# Patient Record
Sex: Female | Born: 1960 | Race: White | Hispanic: No | Marital: Married | Smoking: Former smoker
Health system: Southern US, Community
[De-identification: ages and names within clinical notes are randomized; demographics above are authoritative.]

## PROBLEM LIST (undated history)

## (undated) DIAGNOSIS — R202 Paresthesia of skin: Secondary | ICD-10-CM

## (undated) DIAGNOSIS — K219 Gastro-esophageal reflux disease without esophagitis: Secondary | ICD-10-CM

## (undated) DIAGNOSIS — E785 Hyperlipidemia, unspecified: Secondary | ICD-10-CM

## (undated) DIAGNOSIS — N809 Endometriosis, unspecified: Secondary | ICD-10-CM

## (undated) DIAGNOSIS — Z91014 Allergy to mammalian meats: Secondary | ICD-10-CM

## (undated) DIAGNOSIS — I10 Essential (primary) hypertension: Secondary | ICD-10-CM

## (undated) DIAGNOSIS — A6920 Lyme disease, unspecified: Secondary | ICD-10-CM

## (undated) DIAGNOSIS — F32A Depression, unspecified: Secondary | ICD-10-CM

## (undated) DIAGNOSIS — F419 Anxiety disorder, unspecified: Secondary | ICD-10-CM

## (undated) DIAGNOSIS — R200 Anesthesia of skin: Secondary | ICD-10-CM

## (undated) HISTORY — DX: Depression, unspecified: F32.A

## (undated) HISTORY — DX: Endometriosis, unspecified: N80.9

## (undated) HISTORY — DX: Anesthesia of skin: R20.2

## (undated) HISTORY — DX: Lyme disease, unspecified: A69.20

## (undated) HISTORY — DX: Anxiety disorder, unspecified: F41.9

## (undated) HISTORY — DX: Gastro-esophageal reflux disease without esophagitis: K21.9

## (undated) HISTORY — DX: Allergy to mammalian meats: Z91.014

## (undated) HISTORY — PX: TONSILLECTOMY: SUR1361

## (undated) HISTORY — DX: Anesthesia of skin: R20.0

## (undated) HISTORY — DX: Essential (primary) hypertension: I10

## (undated) HISTORY — DX: Hyperlipidemia, unspecified: E78.5

## (undated) HISTORY — PX: EYE SURGERY: SHX253

## (undated) HISTORY — PX: OTHER SURGICAL HISTORY: SHX169

---

## 2006-08-29 ENCOUNTER — Ambulatory Visit (HOSPITAL_COMMUNITY): Admission: RE | Admit: 2006-08-29 | Discharge: 2006-08-30 | Payer: Self-pay | Admitting: Otolaryngology

## 2008-04-16 IMAGING — CR DG CHEST 2V
2 series · 2 of 2 positions shown · non-contrast
Comparison: None.

CLINICAL DATA: Pre-op eye surgery.
 CHEST - 2 VIEW:

[view not recorded (1 of 2)]
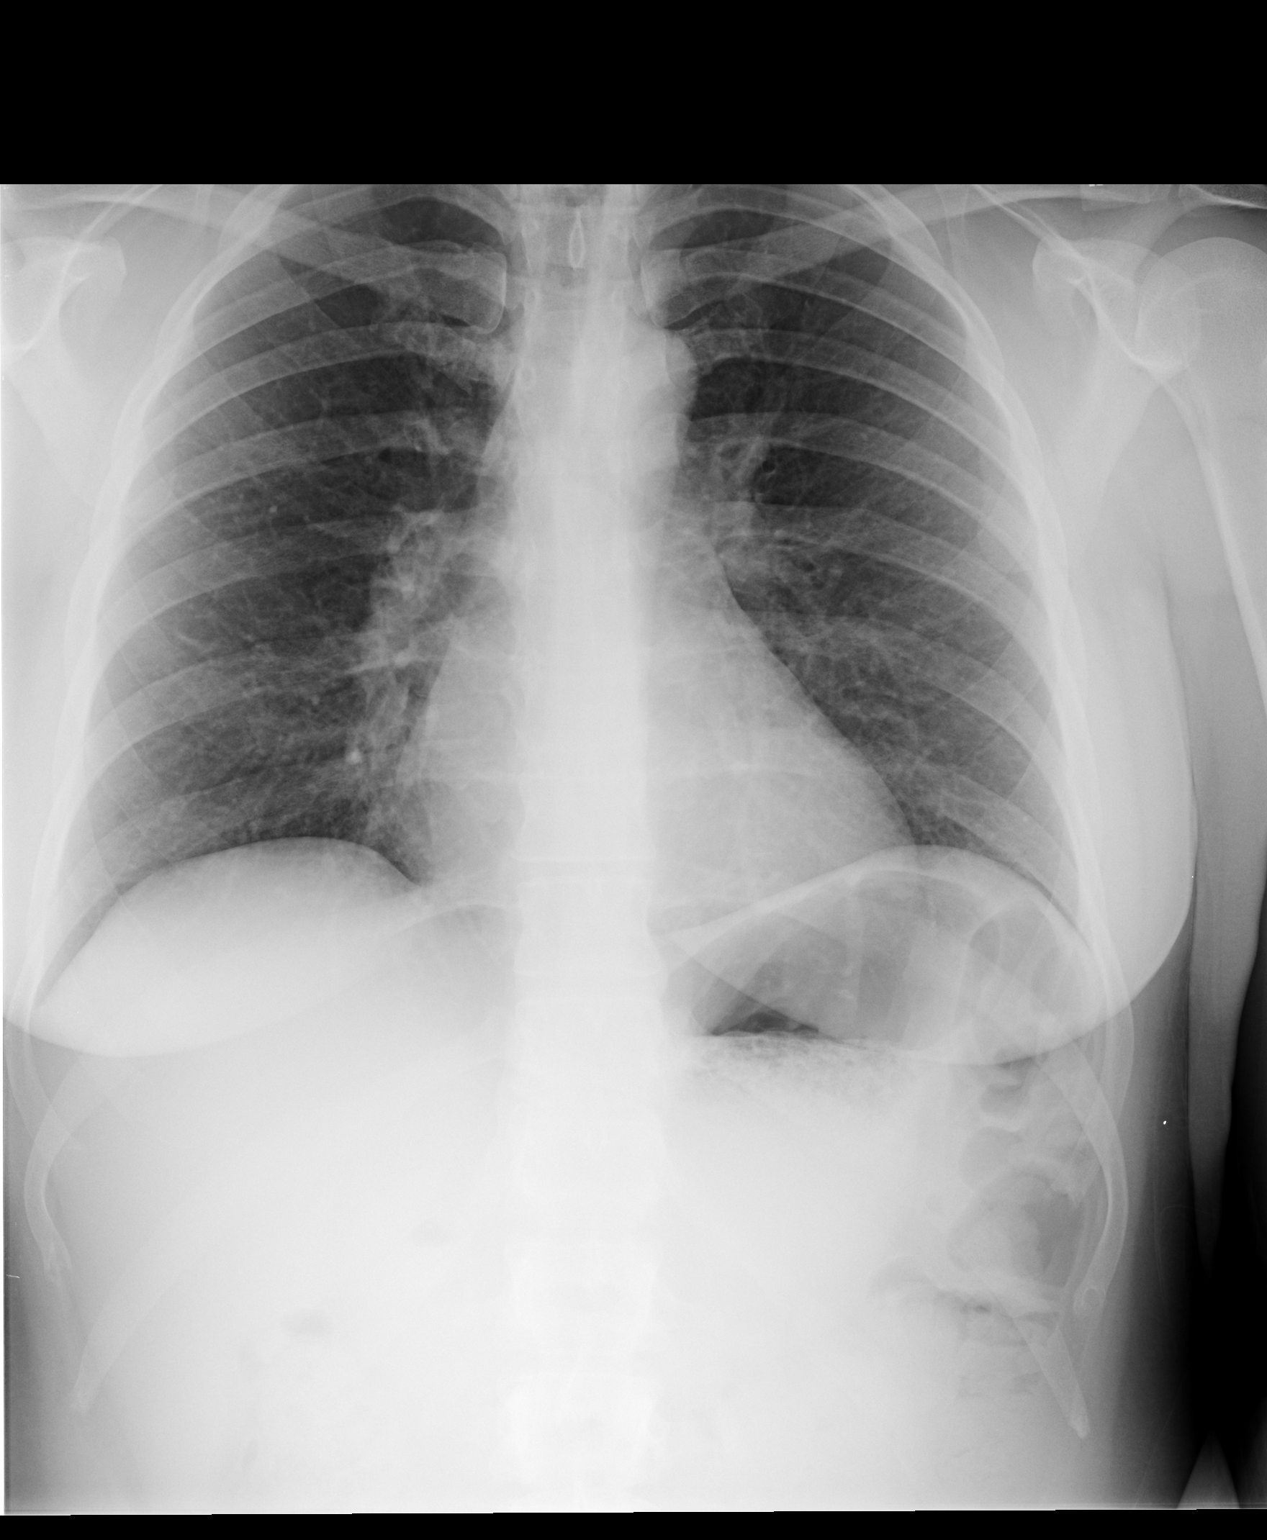

[view not recorded (2 of 2)]
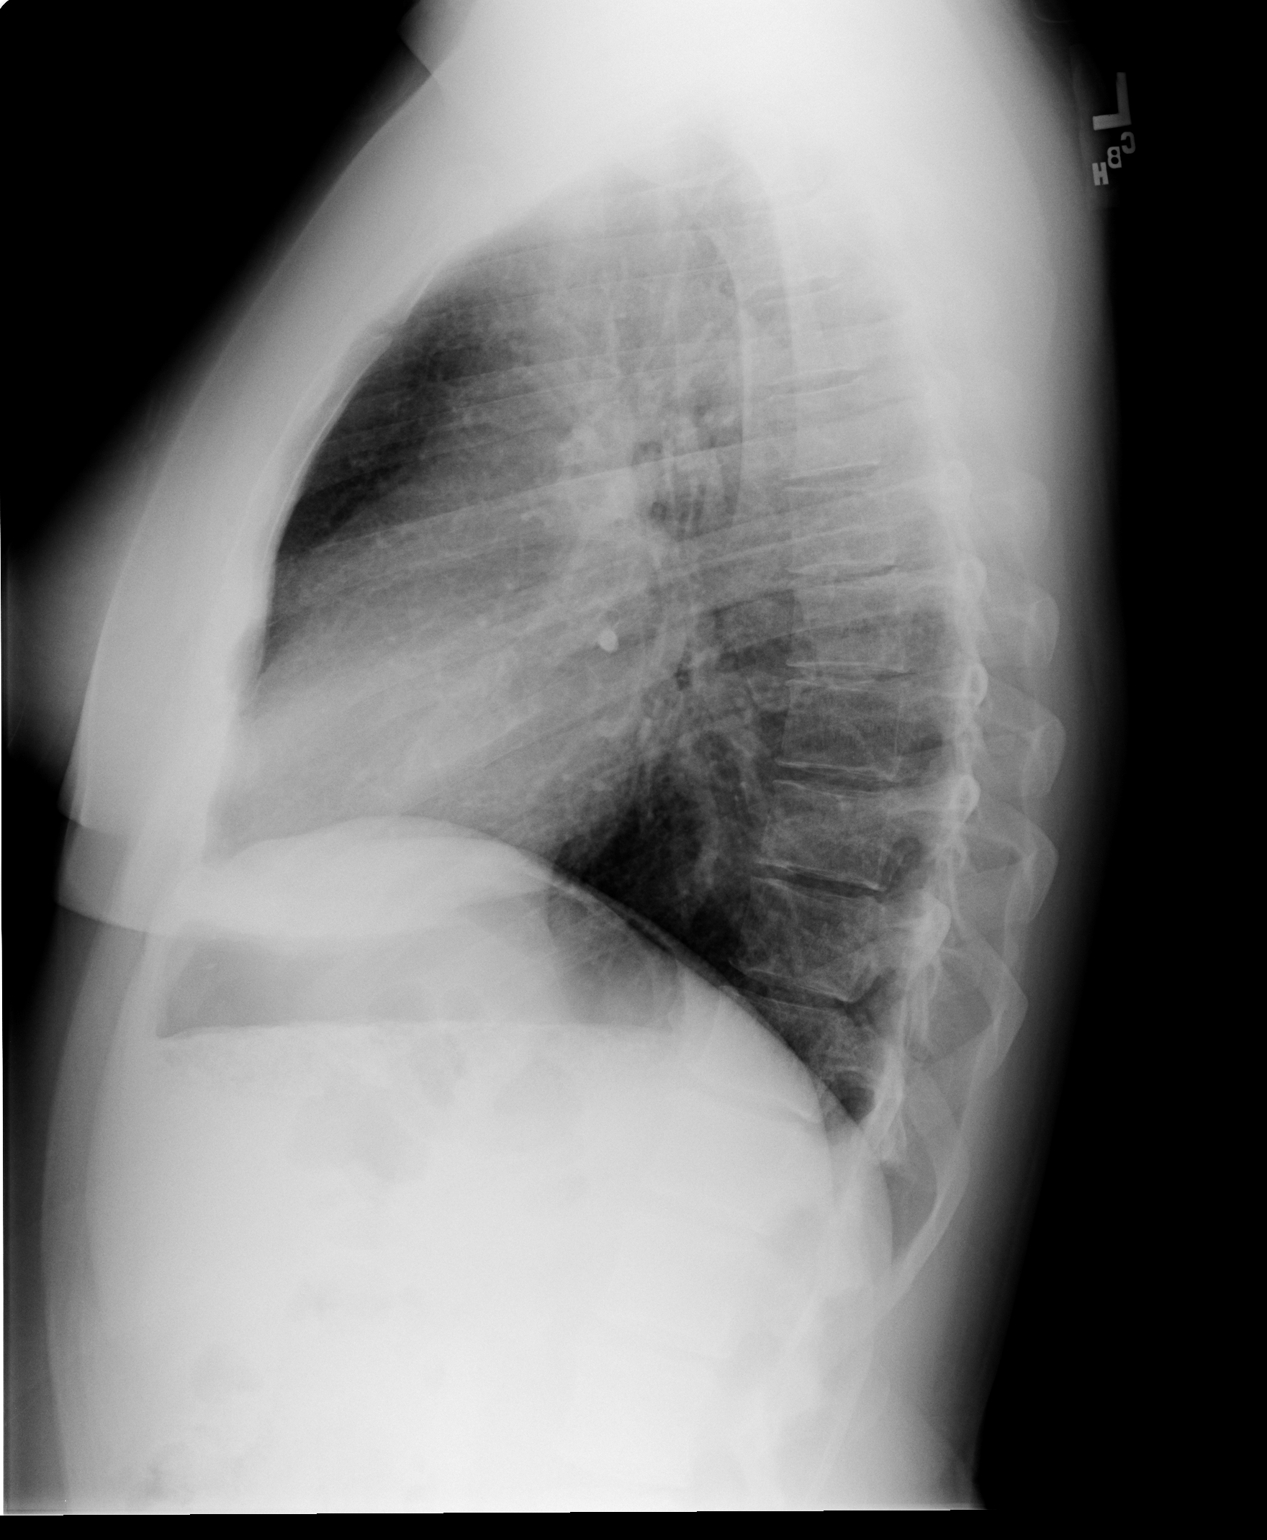

[2 of 2 positions shown; findings below may reference images not displayed]

FINDINGS: Heart size is normal, the vascularity is normal and the lungs are clear.  There is no heart failure or mass.
IMPRESSION: No active disease.

## 2015-12-04 HISTORY — PX: FOOT SURGERY: SHX648

## 2022-09-19 ENCOUNTER — Encounter: Payer: Self-pay | Admitting: *Deleted

## 2022-09-19 ENCOUNTER — Ambulatory Visit: Payer: BC Managed Care – PPO | Admitting: Diagnostic Neuroimaging

## 2022-09-19 VITALS — BP 174/98 | HR 74 | Ht 66.0 in | Wt 201.2 lb

## 2022-09-19 DIAGNOSIS — G4719 Other hypersomnia: Secondary | ICD-10-CM

## 2022-09-19 DIAGNOSIS — R0683 Snoring: Secondary | ICD-10-CM

## 2022-09-19 DIAGNOSIS — R200 Anesthesia of skin: Secondary | ICD-10-CM | POA: Diagnosis not present

## 2022-09-19 NOTE — Progress Notes (Signed)
[facility]  PATIENT: Rachel Ramos DOB: 06-30-1961  REFERRING CLINICIAN: Suzan Slick, MD HISTORY FROM: patient  REASON FOR VISIT: new consult   HISTORICAL  CHIEF COMPLAINT:  Chief Complaint  Patient presents with   Numbness/tingling of Feet    Rm 6 New Pt  Alone "problem x 1 year; stopped lipitor and numbness has improved some- but feels differently; want to discuss sleep study for snoring"    HISTORY OF PRESENT ILLNESS:   61 year old female here for evaluation of numbness and tingling.  Symptoms started about 1 year ago with numbness tingling in the toes and balls of feet.  She was using a different type of office desk, resting her bare feet across a metal bar for long periods of time.  Had a little bit of this in fingertips as well.  No neck pain or low back pain.  No balance or gait difficulty.  Since then she has changed her desk.  She also stopped Lipitor.  Symptoms have slightly improved.  Also asking about sleep study evaluation.  Has some snoring issues, excessive daytime sleepiness.  Has not been evaluated for this in the past.   REVIEW OF SYSTEMS: Full 14 system review of systems performed and negative with exception of: as per HPI.  ALLERGIES: Allergies  Allergen Reactions   Codeine Itching   Other     Cats, dogs    HOME MEDICATIONS: Outpatient Medications Prior to Visit  Medication Sig Dispense Refill   fluticasone (FLONASE) 50 MCG/ACT nasal spray 1 spray into each nostril daily.     montelukast (SINGULAIR) 10 MG tablet Take 10 mg by mouth daily. nightly     omeprazole (PRILOSEC) 20 MG capsule Take 20 mg by mouth daily.     traZODone (DESYREL) 50 MG tablet TAKE ONE-HALF (1/2) TABLET NIGHTLY     triamterene-hydrochlorothiazide (DYAZIDE) 37.5-25 MG capsule Take 1 capsule by mouth daily.     UNKNOWN TO PATIENT Cream for itching     venlafaxine XR (EFFEXOR-XR) 75 MG 24 hr capsule Take 75 mg by mouth daily.     No  facility-administered medications prior to visit.    PAST MEDICAL HISTORY: Past Medical History:  Diagnosis Date   Anxiety    Depression    Endometriosis    GERD (gastroesophageal reflux disease)    Hyperlipidemia    Hypertension    Numbness and tingling of both feet     PAST SURGICAL HISTORY: Past Surgical History:  Procedure Laterality Date   EYE SURGERY     FOOT SURGERY  2017   laproscopic     for endometriosis x 3   TONSILLECTOMY      FAMILY HISTORY: Family History  Problem Relation Age of Onset   Stroke Mother    Stroke Father    Diabetes Father    Neuropathy Sister     SOCIAL HISTORY: Social History   Socioeconomic History   Marital status: Married    Spouse name: Not on file   Number of children: 0   Years of education: Not on file   Highest education level: Some college, no degree  Occupational History   Not on file  Tobacco Use   Smoking status: Former    Types: Cigarettes    Quit date: 12/03/2016    Years since quitting: 5.7   Smokeless tobacco: Never  Substance and Sexual Activity   Alcohol use: Yes    Alcohol/week: 14.0 standard drinks of alcohol    Types: 14  Glasses of wine per week   Drug use: Never   Sexual activity: Not on file  Other Topics Concern   Not on file  Social History Narrative   Lives with husband   Caffeine- coffee 2 c daily, occas soda   Social Determinants of Health   Financial Resource Strain: Not on file  Food Insecurity: Not on file  Transportation Needs: Not on file  Physical Activity: Not on file  Stress: Not on file  Social Connections: Not on file  Intimate Partner Violence: Not on file     PHYSICAL EXAM  GENERAL EXAM/CONSTITUTIONAL: Vitals:  Vitals:   09/19/22 0922  BP: (!) 174/98  Pulse: 74  Weight: 201 lb 3.2 oz (91.3 kg)  Height: 5\' 6"  (1.676 m)   Body mass index is 32.47 kg/m. Wt Readings from Last 3 Encounters:  09/19/22 201 lb 3.2 oz (91.3 kg)   Patient is in no distress; well  developed, nourished and groomed; neck is supple  CARDIOVASCULAR: Examination of carotid arteries is normal; no carotid bruits Regular rate and rhythm, no murmurs Examination of peripheral vascular system by observation and palpation is normal  EYES: Ophthalmoscopic exam of optic discs and posterior segments is normal; no papilledema or hemorrhages No results found.  MUSCULOSKELETAL: Gait, strength, tone, movements noted in Neurologic exam below  NEUROLOGIC: MENTAL STATUS:      No data to display         awake, alert, oriented to person, place and time recent and remote memory intact normal attention and concentration language fluent, comprehension intact, naming intact fund of knowledge appropriate  CRANIAL NERVE:  2nd - no papilledema on fundoscopic exam 2nd, 3rd, 4th, 6th - pupils equal and reactive to light, visual fields full to confrontation, extraocular muscles intact, no nystagmus 5th - facial sensation symmetric 7th - facial strength symmetric 8th - hearing intact 9th - palate elevates symmetrically, uvula midline 11th - shoulder shrug symmetric 12th - tongue protrusion midline  MOTOR:  normal bulk and tone, full strength in the BUE, BLE  SENSORY:  normal and symmetric to light touch, pinprick, temperature, vibration  COORDINATION:  finger-nose-finger, fine finger movements normal  REFLEXES:  deep tendon reflexes present and symmetric  GAIT/STATION:  narrow based gait; able to walk on toes, heels and tandem; romberg is negative     DIAGNOSTIC DATA (LABS, IMAGING, TESTING) - I reviewed patient records, labs, notes, testing and imaging myself where available.  No results found for: "WBC", "HGB", "HCT", "MCV", "PLT" No results found for: "NA", "K", "CL", "CO2", "GLUCOSE", "BUN", "CREATININE", "CALCIUM", "PROT", "ALBUMIN", "AST", "ALT", "ALKPHOS", "BILITOT", "GFRNONAA", "GFRAA" No results found for: "CHOL", "HDL", "LDLCALC", "LDLDIRECT", "TRIG",  "CHOLHDL" No results found for: "HGBA1C" No results found for: "VITAMINB12" No results found for: "TSH"     ASSESSMENT AND PLAN  61 y.o. year old female here with:   Dx:  1. Numbness of toes   2. Snoring   3. Excessive daytime sleepiness       PLAN:  NUMBNESS IN TOES (mild, slightly improving since change office desk position) - could be mild peripheral neuropathy - check neuropathy labs  SNORING / Excessive daytime sleepiness - refer to sleep studies consult  Orders Placed This Encounter  Procedures   TSH   SPEP with IFE   ANA w/Reflex   SSA, SSB   Ambulatory referral to Sleep Studies   Return for pending test results, pending if symptoms worsen or fail to improve.    Penni Bombard,  MD 09/19/2022, 10:26 AM Certified in Neurology, Neurophysiology and Neuroimaging  [facility] [address], [address] [place], [place] [address] [phone]

## 2022-09-22 LAB — MULTIPLE MYELOMA PANEL, SERUM

## 2022-09-22 LAB — TSH: TSH: 1.56 u[IU]/mL (ref 0.450–4.500)

## 2022-09-25 ENCOUNTER — Telehealth: Payer: Self-pay

## 2022-09-25 LAB — MULTIPLE MYELOMA PANEL, SERUM
Albumin/Glob SerPl: 1.4 (ref 0.7–1.7)
Alpha 1: 0.2 g/dL (ref 0.0–0.4)
B-Globulin SerPl Elph-Mcnc: 1.2 g/dL (ref 0.7–1.3)
Globulin, Total: 2.9 g/dL (ref 2.2–3.9)
IgA/Immunoglobulin A, Serum: 247 mg/dL (ref 87–352)
IgG (Immunoglobin G), Serum: 776 mg/dL (ref 586–1602)
IgM (Immunoglobulin M), Srm: 55 mg/dL (ref 26–217)

## 2022-09-25 LAB — SJOGREN'S SYNDROME ANTIBODS(SSA + SSB)
ENA SSA (RO) Ab: <0.2 AI (ref 0.0–0.9)
ENA SSB (LA) Ab: <0.2 AI (ref 0.0–0.9)

## 2022-09-25 LAB — ANA W/REFLEX: ANA Titer 1: NEGATIVE

## 2022-09-25 NOTE — Telephone Encounter (Signed)
See pt message from 09/25/22.

## 2022-09-25 NOTE — Telephone Encounter (Signed)
-----   Message from Penni Bombard, MD sent at 09/25/2022 10:16 AM EDT ----- Normal labs. Please call patient. -VRP

## 2022-10-31 ENCOUNTER — Ambulatory Visit (INDEPENDENT_AMBULATORY_CARE_PROVIDER_SITE_OTHER): Payer: BC Managed Care – PPO | Admitting: Neurology

## 2022-10-31 ENCOUNTER — Encounter: Payer: Self-pay | Admitting: Neurology

## 2022-10-31 VITALS — BP 177/96 | HR 76 | Ht 66.0 in | Wt 201.0 lb

## 2022-10-31 DIAGNOSIS — R351 Nocturia: Secondary | ICD-10-CM

## 2022-10-31 DIAGNOSIS — R0683 Snoring: Secondary | ICD-10-CM | POA: Diagnosis not present

## 2022-10-31 DIAGNOSIS — E669 Obesity, unspecified: Secondary | ICD-10-CM | POA: Diagnosis not present

## 2022-10-31 DIAGNOSIS — Z9189 Other specified personal risk factors, not elsewhere classified: Secondary | ICD-10-CM

## 2022-10-31 DIAGNOSIS — G4719 Other hypersomnia: Secondary | ICD-10-CM | POA: Diagnosis not present

## 2022-10-31 DIAGNOSIS — Z820 Family history of epilepsy and other diseases of the nervous system: Secondary | ICD-10-CM

## 2022-10-31 NOTE — Patient Instructions (Signed)

## 2022-10-31 NOTE — Progress Notes (Signed)
Subjective:    Patient ID: Rachel Ramos is a 61 y.o. female.  HPI    Huston Foley, MD, PhD [facility] [address], [address] [address] [place], [place] [address]  Dear Satira Sark,   I saw your patient, Rachel Ramos, upon your kind request in my sleep clinic today for initial consultation of her sleep disorder, in particular, concern for underlying obstructive sleep apnea.  The patient is unaccompanied today.  As you know, [name] is a 61 year old female with an underlying medical history of allergies, reflux disease, hypertension, hyperlipidemia, paresthesias, anxiety, depression, endometriosis, and mild obesity, who reports snoring and excessive daytime somnolence.  I reviewed your  office note from 09/19/2022.  Her Epworth sleepiness score is 15 out of 24, fatigue severity score is 33 out of 63.  She does not wake up rested, snoring is loud enough to disturb her husband.  He sleeps in a different bedroom.  She has a king-size bed, her dog typically sleeps on the bed with her.  She has a brother with sleep apnea, he uses a machine.  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  Bedtime is generally between 10 and 11 and rise time around 530 if she has to go into the office or 7 to 7:30 AM if she works from home.  She has a 1 hour commute.  She had a tonsillectomy at age 51.  Weight has been more or less stable.  She drinks caffeine in the form of coffee, 2 to 3 cups in the mornings, rare soda, tea occasionally, she drinks alcohol typically 1 or 2 glasses/day.  She has nocturia about once per average night.  She has had no recurrent nocturnal or morning headaches but has had recent mood irritability.  Her Past Medical History Is Significant For: Past Medical History:  Diagnosis Date   Anxiety    Depression    Endometriosis    GERD (gastroesophageal reflux disease)    Hyperlipidemia    Hypertension    Numbness and  tingling of both feet     Her Past Surgical History Is Significant For: Past Surgical History:  Procedure Laterality Date   EYE SURGERY     FOOT SURGERY  2017   laproscopic     for endometriosis x 3   TONSILLECTOMY      Her Family History Is Significant For: Family History  Problem Relation Age of Onset   Stroke Mother    Stroke Father    Diabetes Father    Neuropathy Sister     Her Social History Is Significant For: Social History   Socioeconomic History   Marital status: Married    Spouse name: Not on file   Number of children: 0   Years of education: Not on file   Highest education level: Some college, no degree  Occupational History   Not on file  Tobacco Use   Smoking status: Former    Types: Cigarettes    Quit date: 12/03/2016    Years since quitting: 5.9   Smokeless tobacco: Never  Substance and Sexual Activity   Alcohol use: Yes    Alcohol/week: 14.0 standard drinks of alcohol    Types: 14 Glasses of wine per week   Drug use: Never   Sexual activity: Not on file  Other Topics Concern   Not on file  Social History Narrative   Lives with husband  Caffeine- coffee 2 c daily, occas soda   Right Handed   Social Determinants of Health   Financial Resource Strain: Not on file  Food Insecurity: Not on file  Transportation Needs: Not on file  Physical Activity: Not on file  Stress: Not on file  Social Connections: Not on file    Her Allergies Are:  Allergies  Allergen Reactions   Codeine Itching   Other     Cats, dogs  :   Her Current Medications Are:  Outpatient Encounter Medications as of 10/31/2022  Medication Sig   fluticasone (FLONASE) 50 MCG/ACT nasal spray 1 spray into each nostril daily.   montelukast (SINGULAIR) 10 MG tablet Take 10 mg by mouth daily. nightly   omeprazole (PRILOSEC) 20 MG capsule Take 20 mg by mouth daily.   traZODone (DESYREL) 50 MG tablet TAKE ONE-HALF (1/2) TABLET NIGHTLY   triamterene-hydrochlorothiazide  (DYAZIDE) 37.5-25 MG capsule Take 1 capsule by mouth daily.   UNKNOWN TO PATIENT Cream for itching   valACYclovir (VALTREX) 500 MG tablet Take 500 mg by mouth in the morning and at bedtime.   venlafaxine XR (EFFEXOR-XR) 75 MG 24 hr capsule Take 75 mg by mouth daily.   No facility-administered encounter medications on file as of 10/31/2022.  :   Review of Systems:  Out of a complete 14 point review of systems, all are reviewed and negative with the exception of these symptoms as listed below:   Review of Systems  Neurological:        Pt is here for a Sleep Consult. Pt states that she doesn't have any headaches. Pt states that she feels Fatigue, especially when driving. Pt states that she snores at nighttime.   ESS:15 FSS:33    Objective:  Neurological Exam  Physical Exam Physical Examination:   Vitals:   10/31/22 1403  BP: (!) 177/96  Pulse: 76    General Examination: The patient is a very pleasant 61 y.o. female in no acute distress. She appears well-developed and well groomed.   HEENT: Normocephalic, atraumatic, pupils are equal, round and reactive to light, extraocular tracking is good without limitation to gaze excursion or nystagmus noted. Hearing is grossly intact. Face is symmetric with normal facial animation. Speech is clear with no dysarthria noted. There is no hypophonia. There is no lip, neck/head, jaw or voice tremor. Neck is supple with full range of passive and active motion. There are no carotid bruits on auscultation. Oropharynx exam reveals: mild mouth dryness, adequate dental hygiene and mild airway crowding, due to . Mallampati is class III. Tongue protrudes centrally and palate elevates symmetrically. Tonsils are absent. Neck size is 15.5 inches. She has a moderate overbite. Nasal inspection reveals no significant nasal mucosal bogginess or redness and significant septal deviation to the left.     Chest: Clear to auscultation without wheezing, rhonchi or  crackles noted.  Heart: S1+S2+0, regular and normal without murmurs, rubs or gallops noted.   Abdomen: Soft, non-tender and non-distended.  Extremities: There is trace pitting edema in the distal lower extremities bilaterally.   Skin: Warm and dry without trophic changes noted.   Musculoskeletal: exam reveals no obvious joint deformities.   Neurologically:  Mental status: The patient is awake, alert and oriented in all 4 spheres. Her immediate and remote memory, attention, language skills and fund of knowledge are appropriate. There is no evidence of aphasia, agnosia, apraxia or anomia. Speech is clear with normal prosody and enunciation. Thought process is linear. Mood is normal and  affect is normal.  Cranial nerves II - XII are as described above under HEENT exam.  Motor exam: Normal bulk, strength and tone is noted. There is no obvious action or resting tremor.  Fine motor skills and coordination: grossly intact.  Cerebellar testing: No dysmetria or intention tremor. There is no truncal or gait ataxia.  Sensory exam: intact to light touch in the upper and lower extremities.  Gait, station and balance: She stands easily. No veering to one side is noted. No leaning to one side is noted. Posture is age-appropriate and stance is narrow based. Gait shows normal stride length and normal pace. No problems turning are noted.   Assessment and Plan:   In summary, Rachel Ramos is a very pleasant 61 y.o.-year old female with an underlying medical history of allergies, reflux disease, hypertension, hyperlipidemia, paresthesias, anxiety, depression, endometriosis, and mild obesity, whose history and physical exam are concerning for sleep disordered breathing, supporting a current working diagnosis of unspecified sleep apnea, with the main differential diagnoses of obstructive sleep apnea (OSA) versus upper airway resistance syndrome (UARS) versus central sleep apnea (CSA), or mixed sleep apnea. A  laboratory attended sleep study is typically considered "gold standard" for evaluation of sleep disordered breathing.   I had a long chat with the patient about my findings and the diagnosis of sleep apnea, particularly OSA, its prognosis and treatment options. We talked about medical/conservative treatments, surgical interventions and non-pharmacological approaches for symptom control. I explained, in particular, the risks and ramifications of untreated moderate to severe OSA, especially with respect to developing cardiovascular disease down the road, including congestive heart failure (CHF), difficult to treat hypertension, cardiac arrhythmias (particularly A-fib), neurovascular complications including TIA, stroke and dementia. Even type 2 diabetes has, in part, been linked to untreated OSA. Symptoms of untreated OSA may include (but may not be limited to) daytime sleepiness, nocturia (i.e. frequent nighttime urination), memory problems, mood irritability and suboptimally controlled or worsening mood disorder such as depression and/or anxiety, lack of energy, lack of motivation, physical discomfort, as well as recurrent headaches, especially morning or nocturnal headaches. We talked about the importance of maintaining a healthy lifestyle and striving for healthy weight. In addition, we talked about the importance of striving for and maintaining good sleep hygiene.  She is in particular advised not to drink alcohol daily, limit herself to less than 1 drink per day, she is advised that alcohol is a known sleep disrupter. I recommended a sleep study at this time. I outlined the differences between a laboratory attended sleep study which is considered more comprehensive and accurate over the option of a home sleep test (HST); the latter may lead to underestimation of sleep disordered breathing in some instances and does not help with diagnosing upper airway resistance syndrome and is not accurate enough to diagnose  primary central sleep apnea typically. I outlined possible surgical and non-surgical treatment options of OSA, including the use of a positive airway pressure (PAP) device (i.e. CPAP, AutoPAP/APAP or BiPAP in certain circumstances), a custom-made dental device (aka oral appliance, which would require a referral to a specialist dentist or orthodontist typically, and is generally speaking not considered for patients with full dentures or edentulous state), upper airway surgical options, such as traditional UPPP (which is not considered a first-line treatment) or the Inspire device (hypoglossal nerve stimulator, which would involve a referral for consultation with an ENT surgeon, after careful selection, following inclusion criteria - also not first-line treatment). I explained the PAP  treatment option to the patient in detail, as this is generally considered first-line treatment.  The patient indicated that she would be willing to try PAP therapy, if the need arises. I explained the importance of being compliant with PAP treatment, not only for insurance purposes but primarily to improve patient's symptoms symptoms, and for the patient's long term health benefit, including to reduce Her cardiovascular risks longer-term.    We will pick up our discussion about the next steps and treatment options after testing.  We will keep her posted as to the test results by phone call and/or MyChart messaging where possible.  We will plan to follow-up in sleep clinic accordingly as well.  I answered all her questions today and the patient was in agreement.   I encouraged her to call with any interim questions, concerns, problems or updates or email us through MyChart.  Generally speaking, sleep test authorizations may take up to 2 weeks, sometimes less, sometimes longer, the patient is encouraged to get in touch with us if they do not hear back from the sleep lab staff directly within the next 2 weeks.  Thank you very much  for allowing me to participate in the care of this nice patient. If I can be of any further assistance to you please do not hesitate to talk to me.  Sincerely,   Huston FoleySaima Nyah Shepherd, MD, PhD

## 2022-11-20 ENCOUNTER — Telehealth: Payer: Self-pay | Admitting: Neurology

## 2022-11-20 NOTE — Telephone Encounter (Signed)
HST- [employer] [place]: [id number] (exp. 11/12/22 to 01/10/23)  Patient is scheduled at [facility] for 12/18/22 at 8:30 AM.  Mailed packet to the patient.

## 2022-12-04 NOTE — Telephone Encounter (Signed)
Since patient got new insurance [employer] she wanted to see if [facility] would approve the NPSG.  I started the case it is pending uploaded notes on the portal.  [place] no auth req right now she is scheduled for 12/18/22 at 8:30 AM.

## 2022-12-10 NOTE — Telephone Encounter (Signed)
Checked the status it is still pending  

## 2022-12-12 NOTE — Telephone Encounter (Signed)
[employer] denied the NPSG  Denial reason: "We found that this request does not meet the following criteria:  -you have a significant medical condition that prevents accurate home sleep testing. -you have had a prior home sleep test that has negative or inadequate, yet you have symptoms that suggest a sleep disorder. -based upon our review, we are not able to approve this treatment request. Under your health plan, this sleep testing is not medically necessary for your condition.

## 2022-12-18 ENCOUNTER — Ambulatory Visit: Payer: 59 | Admitting: Neurology

## 2022-12-18 DIAGNOSIS — R351 Nocturia: Secondary | ICD-10-CM

## 2022-12-18 DIAGNOSIS — Z9189 Other specified personal risk factors, not elsewhere classified: Secondary | ICD-10-CM

## 2022-12-18 DIAGNOSIS — G4733 Obstructive sleep apnea (adult) (pediatric): Secondary | ICD-10-CM

## 2022-12-18 DIAGNOSIS — E669 Obesity, unspecified: Secondary | ICD-10-CM

## 2022-12-18 DIAGNOSIS — Z820 Family history of epilepsy and other diseases of the nervous system: Secondary | ICD-10-CM

## 2022-12-18 DIAGNOSIS — R0683 Snoring: Secondary | ICD-10-CM

## 2022-12-18 DIAGNOSIS — G4719 Other hypersomnia: Secondary | ICD-10-CM

## 2022-12-19 NOTE — Progress Notes (Signed)
See procedure note.

## 2022-12-20 NOTE — Addendum Note (Signed)
Addended by: Star Age on: 12/20/2022 06:09 PM   Modules accepted: Orders

## 2022-12-20 NOTE — Procedures (Signed)
   [facility]  HOME SLEEP TEST (Watch PAT) REPORT  STUDY DATE: 12/18/2022  DOB: Nov 27, 1961  MRN: [id number]  ORDERING CLINICIAN: Star Age, MD, PhD   REFERRING CLINICIAN: Dr. Leta Baptist  CLINICAL INFORMATION/HISTORY: 62 year old female with an underlying medical history of allergies, reflux disease, hypertension, hyperlipidemia, paresthesias, anxiety, depression, endometriosis, and mild obesity, who reports snoring and excessive daytime somnolence.   Epworth sleepiness score: 15/24.  BMI: 32.4 kg/m  FINDINGS:   Sleep Summary:   Total Recording Time (hours, min): 9 hours, 19 min  Total Sleep Time (hours, min):  8 hours, 18 min  Percent REM (%):    26.4%   Respiratory Indices:   Calculated pAHI (per hour):  18.7/hour         REM pAHI:    27.5/hour       NREM pAHI: 15.5/hour  Central pAHI: 1/hour  Oxygen Saturation Statistics:    Oxygen Saturation (%) Mean: 93%   Minimum oxygen saturation (%):                 82%   O2 Saturation Range (%): 82 - 97%    O2 Saturation (minutes) <=88%: 5.2 min  Pulse Rate Statistics:   Pulse Mean (bpm):    83/min    Pulse Range (67 - 114/min)   IMPRESSION: OSA (obstructive sleep apnea), moderate  RECOMMENDATION:  This home sleep test demonstrates moderate obstructive sleep apnea with a total AHI of 18.7/hour and O2 nadir of 82%.  Mild to moderate snoring was detected. Treatment with a positive airway pressure (PAP) device is recommended. The patient will be advised to proceed with an autoPAP titration/trial at home for now. A full night titration study may be considered to optimize treatment settings, monitor proper oxygen saturations and aid with improvement of tolerance and adherence, if needed down the road. Alternative treatment options may include a dental device through dentistry or orthodontics in selected patients or Inspire (hypoglossal nerve stimulator) in carefully selected patients (meeting  inclusion criteria).  Concomitant weight loss is recommended (where clinically appropriate). Please note that untreated obstructive sleep apnea may carry additional perioperative morbidity. Patients with significant obstructive sleep apnea should receive perioperative PAP therapy and the surgeons and particularly the anesthesiologist should be informed of the diagnosis and the severity of the sleep disordered breathing. The patient should be cautioned not to drive, work at heights, or operate dangerous or heavy equipment when tired or sleepy. Review and reiteration of good sleep hygiene measures should be pursued with any patient. Other causes of the patient's symptoms, including circadian rhythm disturbances, an underlying mood disorder, medication effect and/or an underlying medical problem cannot be ruled out based on this test. Clinical correlation is recommended.  The patient and her referring provider will be notified of the test results. The patient will be seen in follow up in sleep clinic at [facility].  I certify that I have reviewed the raw data recording prior to the issuance of this report in accordance with the standards of the American Academy of Sleep Medicine (AASM).  INTERPRETING PHYSICIAN:   Star Age, MD, PhD Medical Director, [facility]) [place], ABPN (Neurology and Sleep)   [facility] [address], [place], [place] [address] [phone]

## 2022-12-27 ENCOUNTER — Telehealth: Payer: Self-pay | Admitting: *Deleted

## 2022-12-27 NOTE — Telephone Encounter (Signed)
-----  Message from Star Age, MD sent at 12/20/2022  6:09 PM EST ----- Patient referred by Dr. Leta Baptist, seen by me on 10/31/2022, patient had a HST on 12/18/2022.    Please call and notify the patient that the recent home sleep test showed obstructive sleep apnea in the moderate range. I recommend treatment in the form of autoPAP, which means, that we don't have to bring her in for a sleep study with CPAP, but will let her start using a so called autoPAP machine at home, which is a CPAP-like machine with self-adjusting pressures. We will send the order to a local DME company (of her choice, or as per insurance requirement). The DME representative will fit her with a mask, educate her on how to use the machine, how to put the mask on, etc. I have placed an order in the chart. Please send the order, talk to patient, send report to referring MD. We will need a FU in sleep clinic for 10 weeks post-PAP set up, please arrange that with me or one of our NPs. Also reinforce the need for compliance with treatment. Thanks,   Star Age, MD, PhD [facility])

## 2022-12-27 NOTE — Telephone Encounter (Signed)
Spoke with patient and discussed her sleep study results. Pt verbalized understanding and is amenable to starting auto-pap. We discussed insurance compliance requirements which includes using the machine at least 4 hours at night and also being seen in our office between 30 and 90 days after setup. I scheduled pt for initial f/u on 03/21/23 @ 945 am arrival 915 am. We discussed options for DME. Will refer to [facility].    Referral faxed to [place]. Received a receipt of confirmation. Result sent to referring provider.

## 2023-01-14 NOTE — Telephone Encounter (Addendum)
Spoke with patient . Pt has [place] [employer] . Per [employer] is not in network with them . Pt sending copy of insurance card front and back and   I will fax over  order to [facility] when receive copy of insurance card

## 2023-01-16 NOTE — Telephone Encounter (Signed)
Faxed over orders to [facility] this am

## 2023-03-21 ENCOUNTER — Ambulatory Visit: Payer: 59 | Admitting: Neurology

## 2023-05-06 ENCOUNTER — Ambulatory Visit: Payer: 59 | Admitting: Neurology

## 2023-05-06 ENCOUNTER — Encounter: Payer: Self-pay | Admitting: Neurology

## 2023-05-06 VITALS — BP 119/70 | HR 77 | Ht 66.0 in | Wt 205.0 lb

## 2023-05-06 DIAGNOSIS — G4733 Obstructive sleep apnea (adult) (pediatric): Secondary | ICD-10-CM

## 2023-05-06 NOTE — Patient Instructions (Signed)
It was nice to see you again today. I am glad to hear, things are going well with your autoPAP therapy. You have adjusted well to treatment with your new machine, and you are compliant with it. You have also fulfilled the insurance-mandated compliance percentage, which is reassuring, so you can get ongoing supplies through your insurance. Please talk to your DME provider about getting replacement supplies on a regular basis. Please be sure to change your filter every month, your mask about every 3 months, hose about every 6 months, humidifier chamber about yearly. Some restrictions are imposed by your insurance carrier with regard to how frequently you can get certain supplies.  Your DME company can provide further details if necessary.   Please continue using your autoPAP regularly. While your insurance requires that you use PAP at least 4 hours each night on 70% of the nights, I recommend, that you not skip any nights and use it throughout the night if you can. Getting used to PAP and staying with the treatment long term does take time and patience and discipline. Untreated obstructive sleep apnea when it is moderate to severe can have an adverse impact on cardiovascular health and raise her risk for heart disease, arrhythmias, hypertension, congestive heart failure, stroke and diabetes. Untreated obstructive sleep apnea causes sleep disruption, nonrestorative sleep, and sleep deprivation. This can have an impact on your day to day functioning and cause daytime sleepiness and impairment of cognitive function, memory loss, mood disturbance, and problems focussing. Using PAP regularly can improve these symptoms.  We can see you in 1 year, you can see one of our nurse practitioners as you are stable.   

## 2023-05-06 NOTE — Progress Notes (Signed)
Order for mask refit sent to [facility]. [facility] confirmed receipt of order.  

## 2023-05-06 NOTE — Progress Notes (Signed)
Subjective:    Patient ID: Rachel Ramos is a 62 y.o. female.  HPI    Interim history:   Rachel Ramos is a 62 year old female with an underlying medical history of allergies, reflux disease, hypertension, hyperlipidemia, paresthesias, anxiety, depression, endometriosis, and mild obesity, who presents for follow-up consultation of her obstructive sleep apnea after interim testing and starting home AutoPap therapy.  The patient is unaccompanied today.  I first met her at the request of Dr. Marjory Lies on 10/31/2022, at which time she reported snoring and excessive daytime somnolence.  She was advised to proceed with a sleep study.  She had a home sleep test on 12/18/2022 which showed moderate obstructive sleep apnea with a total AHI of 18.7/hour and O2 nadir of 82%.  Mild to moderate snoring was detected.  She was advised to proceed with home AutoPap therapy.  Her set up date was 02/12/2023.  She has a ResMed air sense 10 AutoSet machine.  Her DME company is [facility].  Today, 05/06/2023: I reviewed her AutoPap compliance data from 03/18/2023 through 04/16/2023, which is a total of 30 days, during which time she used her machine every night with percent use days greater than 4 hours of 70%, indicating adequate compliance, average usage of 4 hours and 31 minutes, residual AHI at goal at 2.1/h, average pressure for the 95th percentile at 9.9 cm with a range of 5 to 12 cm with EPR of 3.  Leak in the acceptable range with some fluctuation, 95th percentile at 13.2 L/min.  She reports doing pretty well with her treatment, she has adjusted to treatment but would like to try a different mask.  She is currently using a nasal cushion interface but would like to try a different model.  She is a side sleeper and sometimes the mask dislodges and causes a leak which is disturbing to her.  Overall, she feels improved with regards to her daytime somnolence, daytime energy level and she is motivated to continue with  treatment.  She is still working full-time but this may change in the near future.  She does not always sleep well.  Her Epworth sleepiness score is 11 out of 24, previously was 15 out of 24.  The patient's allergies, current medications, family history, past medical history, past social history, past surgical history and problem list were reviewed and updated as appropriate.   Previously:   10/31/22: (She) reports snoring and excessive daytime somnolence.  I reviewed your  office note from 09/19/2022.  Her Epworth sleepiness score is 15 out of 24, fatigue severity score is 33 out of 63.  She does not wake up rested, snoring is loud enough to disturb her husband.  He sleeps in a different bedroom.  She has a king-size bed, her dog typically sleeps on the bed with her.  She has a brother with sleep apnea, he uses a machine.  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  Bedtime is generally between 10 and 11 and rise time around 530 if she has to go into the office or 7 to 7:30 AM if she works from home.  She has a 1 hour commute.  She had a tonsillectomy at age 53.  Weight has been more or less stable.  She drinks caffeine in the form of coffee, 2 to 3 cups in the mornings, rare soda, tea occasionally, she drinks alcohol typically 1 or 2 glasses/day.  She  has nocturia about once per average night.  She has had no recurrent nocturnal or morning headaches but has had recent mood irritability.    Her Past Medical History Is Significant For: Past Medical History:  Diagnosis Date   Anxiety    Depression    Endometriosis    GERD (gastroesophageal reflux disease)    Hyperlipidemia    Hypertension    Numbness and tingling of both feet     Her Past Surgical History Is Significant For: Past Surgical History:  Procedure Laterality Date   EYE SURGERY     FOOT SURGERY  2017   laproscopic     for endometriosis x 3   TONSILLECTOMY      Her Family History  Is Significant For: Family History  Problem Relation Age of Onset   Stroke Mother    Stroke Father    Diabetes Father    Neuropathy Sister    Sleep apnea Brother     Her Social History Is Significant For: Social History   Socioeconomic History   Marital status: Married    Spouse name: Not on file   Number of children: 0   Years of education: Not on file   Highest education level: Some college, no degree  Occupational History   Not on file  Tobacco Use   Smoking status: Former    Types: Cigarettes    Quit date: 12/03/2016    Years since quitting: 6.4   Smokeless tobacco: Never  Substance and Sexual Activity   Alcohol use: Yes    Alcohol/week: 14.0 standard drinks of alcohol    Types: 14 Glasses of wine per week   Drug use: Never   Sexual activity: Not on file  Other Topics Concern   Not on file  Social History Narrative   Lives with husband   Caffeine- coffee 2 c daily, occas soda   Right Handed   Social Determinants of Health   Financial Resource Strain: Not on file  Food Insecurity: Not on file  Transportation Needs: Not on file  Physical Activity: Not on file  Stress: Not on file  Social Connections: Not on file    Her Allergies Are:  Allergies  Allergen Reactions   Codeine Itching   Other     Cats, dogs  :   Her Current Medications Are:  Outpatient Encounter Medications as of 05/06/2023  Medication Sig   amLODipine (NORVASC) 10 MG tablet Take 1 tablet by mouth daily.   fluticasone (FLONASE) 50 MCG/ACT nasal spray 1 spray into each nostril daily.   montelukast (SINGULAIR) 10 MG tablet Take 10 mg by mouth daily. nightly   olmesartan (BENICAR) 20 MG tablet Take 1 tablet by mouth daily.   omeprazole (PRILOSEC) 20 MG capsule Take 20 mg by mouth daily.   traZODone (DESYREL) 50 MG tablet TAKE ONE-HALF (1/2) TABLET NIGHTLY   triamterene-hydrochlorothiazide (DYAZIDE) 37.5-25 MG capsule Take 1 capsule by mouth daily.   UNKNOWN TO PATIENT Cream for itching    valACYclovir (VALTREX) 500 MG tablet Take 500 mg by mouth in the morning and at bedtime.   venlafaxine XR (EFFEXOR-XR) 75 MG 24 hr capsule Take 75 mg by mouth daily.   furosemide (LASIX) 20 MG tablet Take by mouth as needed. (Patient not taking: Reported on 05/06/2023)   No facility-administered encounter medications on file as of 05/06/2023.  :  Review of Systems:  Out of a complete 14 point review of systems, all are reviewed and negative with the exception of  these symptoms as listed below:  Review of Systems  Neurological:        Pt here for CPAP f/u Pt states she is feeling and sleeping better    ESS:11     Objective:  Neurological Exam  Physical Exam Physical Examination:   Vitals:   05/06/23 0853  BP: 119/70  Pulse: 77    General Examination: The patient is a very pleasant 62 y.o. female in no acute distress. She appears well-developed and well-nourished and well groomed.   HEENT: Normocephalic, atraumatic, pupils are equal, round and reactive to light, extraocular tracking is well-preserved.  Hearing is grossly intact.  Face is symmetric with normal facial animation, speech is clear without dysarthria, hypophonia or voice tremor.  Neck with full range of motion, no carotid bruits on auscultation.  Airway examination reveals mild mouth dryness, otherwise stable findings.  Tongue protrudes centrally and palate elevates symmetrically.     Chest: Clear to auscultation without wheezing, rhonchi or crackles noted.   Heart: S1+S2+0, regular and normal without murmurs, rubs or gallops noted.    Abdomen: Soft, non-tender and non-distended.   Extremities: There is no obvious edema in the distal lower extremities bilaterally.    Skin: Warm and dry without trophic changes noted.    Musculoskeletal: exam reveals no obvious joint deformities.    Neurologically:  Mental status: The patient is awake, alert and oriented in all 4 spheres. Her immediate and remote memory, attention,  language skills and fund of knowledge are appropriate. There is no evidence of aphasia, agnosia, apraxia or anomia. Speech is clear with normal prosody and enunciation. Thought process is linear. Mood is normal and affect is normal.  Cranial nerves II - XII are as described above under HEENT exam.  Motor exam: Normal bulk, moving all 4 extremities without limitation, no obvious action or resting tremor.  Fine motor skills and coordination: grossly intact.  Cerebellar testing: No dysmetria or intention tremor. There is no truncal or gait ataxia.  Sensory exam: intact to light touch in the upper and lower extremities.  Gait, station and balance: She stands easily. No veering to one side is noted. No leaning to one side is noted. Posture is age-appropriate and stance is narrow based. Gait shows normal stride length and normal pace. No problems turning are noted.    Assessment and Plan:    In summary, Rachel Ramos is a very pleasant 62 year old female with an underlying medical history of allergies, reflux disease, hypertension, hyperlipidemia, paresthesias, anxiety, depression, endometriosis, and mild obesity, who presents for follow-up consultation of her obstructive sleep apnea after interim testing and starting home AutoPap therapy.  She had a home sleep test on 12/18/2022 which showed moderate obstructive sleep apnea with a total AHI of 18.7/hour and O2 nadir of 82%.  Mild to moderate snoring was detected.  She has been on home AutoPap therapy since 02/12/2023.  She has a ResMed air sense 10 AutoSet machine.  Her DME company is [facility].  She uses a nasal cushion interface but would like to try a different model.  I have placed an order for mask fit.  She is commended for treatment adherence and endorses improvement in her energy level during the day as well as daytime somnolence.  She is motivated to continue with treatment, she is encouraged to be consistent with her AutoPap and try to get at  least 7 hours of usage on an average night, she currently averages 4-1/2 hours.  She is  advised to follow-up routinely to see one of our nurse practitioners in sleep clinic in about a year, we can offer her a video visit if she prefers.  I answered all her questions today, we reviewed her home sleep test results as well as her compliance data in detail today.  She was in agreement with the plan. I spent 30 minutes in total face-to-face time and in reviewing records during pre-charting, more than 50% of which was spent in counseling and coordination of care, reviewing test results, reviewing medications and treatment regimen and/or in discussing or reviewing the diagnosis of OSA, the prognosis and treatment options. Pertinent laboratory and imaging test results that were available during this visit with the patient were reviewed by me and considered in my medical decision making (see chart for details).

## 2024-01-29 ENCOUNTER — Encounter: Payer: Self-pay | Admitting: Student in an Organized Health Care Education/Training Program

## 2024-02-11 ENCOUNTER — Ambulatory Visit: Payer: Self-pay | Admitting: Student in an Organized Health Care Education/Training Program

## 2024-02-12 ENCOUNTER — Ambulatory Visit: Payer: Self-pay | Admitting: Student in an Organized Health Care Education/Training Program

## 2024-05-07 ENCOUNTER — Ambulatory Visit: Payer: 59 | Admitting: Adult Health

## 2024-08-06 ENCOUNTER — Ambulatory Visit: Admitting: Adult Health

## 2024-11-02 ENCOUNTER — Ambulatory Visit: Admitting: Neurology

## 2024-11-02 ENCOUNTER — Encounter: Payer: Self-pay | Admitting: Neurology

## 2024-11-02 VITALS — BP 143/79 | HR 71 | Ht 66.0 in | Wt 210.4 lb

## 2024-11-02 DIAGNOSIS — Z789 Other specified health status: Secondary | ICD-10-CM | POA: Diagnosis not present

## 2024-11-02 DIAGNOSIS — G4733 Obstructive sleep apnea (adult) (pediatric): Secondary | ICD-10-CM | POA: Diagnosis not present

## 2024-11-02 NOTE — Progress Notes (Signed)
 Subjective:    Patient ID: Rachel Ramos is a 63 y.o. female.  HPI     Interim history:   Rachel Ramos is a 63 year old female with an underlying medical history of allergies, reflux disease, hypertension, hyperlipidemia, paresthesias, anxiety, depression, endometriosis, and mild obesity, who presents for follow-up consultation of her obstructive sleep apnea, on AutoPap therapy.  The patient is unaccompanied today and presents for her 1 year checkup.  I last saw her in June 2024, at which time we talked about her AutoPap compliance and her home sleep test results from January 2024 which showed evidence of moderate obstructive sleep apnea.  She reported improvement of her sleep-related symptoms and was still adjusting to treatment with AutoPap therapy, was generally compliant with treatment and was advised to follow-up routinely in 1 year in this clinic.  Today, 11/02/2024: I reviewed her AutoPap compliance data from the past year.  She has not really used her AutoPap consistently, essentially stopped using it sometime in early July.  She was inconsistent with her usage for months.  She had a few days in January 2025 and a few days in February 2025.  She reports that she had a lot going on.  She also got laid off in March 2025 and reports stress.  She reports that she had excessive supplies coming in from 2 different DME providers, she is not sure as to who the second provider was, she does have [employer] as her DME company.  She is interested in getting back consistently on her AutoPap and would like a mask fit appointment with [employer].  Her average AHI has been at goal at 1.7/h, leak generally acceptable with the 95th percentile of 15.7 L/min, 95th percentile of pressure at 7.5 cm with a range of 5 to 12 cm with EPR of 3, again, this is with only limited data available over the past year.  She reports that she has plantar fasciitis.  She also reports a recent diagnosis of Lyme disease.  She  is followed by a homeopathic doctor.  She takes trazodone half a pill at bedtime and is still on Effexor.  She no longer takes Lasix.  Current bedtime is generally between 11 and midnight and rise time between 8:30 PM and 9.  She has nocturia about once per average night.  Her Epworth sleepiness score is 8 out of 24, fatigue severity score is 14 out of 63.  The patient's allergies, current medications, family history, past medical history, past social history, past surgical history and problem list were reviewed and updated as appropriate.    Previously:    05/06/2023: I first met her at the request of Rachel Ramos on 10/31/2022, at which time she reported snoring and excessive daytime somnolence.  She was advised to proceed with a sleep study.  She had a home sleep test on 12/18/2022 which showed moderate obstructive sleep apnea with a total AHI of 18.7/hour and O2 nadir of 82%.  Mild to moderate snoring was detected.  She was advised to proceed with home AutoPap therapy.  Her set up date was 02/12/2023.  She has a ResMed air sense 10 AutoSet machine.  Her DME company is [employer].   I reviewed her AutoPap compliance data from 03/18/2023 through 04/16/2023, which is a total of 30 days, during which time she used her machine every night with percent use days greater than 4 hours of 70%, indicating adequate compliance, average usage of 4 hours and 31 minutes, residual  AHI at goal at 2.1/h, average pressure for the 95th percentile at 9.9 cm with a range of 5 to 12 cm with EPR of 3.  Leak in the acceptable range with some fluctuation, 95th percentile at 13.2 L/min.  She reports doing pretty well with her treatment, she has adjusted to treatment but would like to try a different mask.  She is currently using a nasal cushion interface but would like to try a different model.  She is a side sleeper and sometimes the mask dislodges and causes a leak which is disturbing to her.  Overall, she feels improved with  regards to her daytime somnolence, daytime energy level and she is motivated to continue with treatment.  She is still working full-time but this may change in the near future.  She does not always sleep well.  Her Epworth sleepiness score is 11 out of 24, previously was 15 out of 24.     10/31/22: (She) reports snoring and excessive daytime somnolence.  I reviewed your  office note from 09/19/2022.  Her Epworth sleepiness score is 15 out of 24, fatigue severity score is 33 out of 63.  She does not wake up rested, snoring is loud enough to disturb her husband.  He sleeps in a different bedroom.  She has a king-size bed, her dog typically sleeps on the bed with her.  She has a brother with sleep apnea, he uses a machine.  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  Bedtime is generally between 10 and 11 and rise time around 530 if she has to go into the office or 7 to 7:30 AM if she works from home.  She has a 1 hour commute.  She had a tonsillectomy at age 58.  Weight has been more or less stable.  She drinks caffeine in the form of coffee, 2 to 3 cups in the mornings, rare soda, tea occasionally, she drinks alcohol typically 1 or 2 glasses/day.  She has nocturia about once per average night.  She has had no recurrent nocturnal or morning headaches but has had recent mood irritability.      Her Past Medical History Is Significant For: Past Medical History:  Diagnosis Date   Alpha-gal syndrome    Anxiety    Depression    Endometriosis    GERD (gastroesophageal reflux disease)    Hyperlipidemia    Hypertension    Lyme disease    2025   Numbness and tingling of both feet     Her Past Surgical History Is Significant For: Past Surgical History:  Procedure Laterality Date   EYE SURGERY     FOOT SURGERY  2017   laproscopic     for endometriosis x 3   TONSILLECTOMY      Her Family History Is Significant For: Family History  Problem Relation  Age of Onset   Stroke Mother    Stroke Father    Diabetes Father    Neuropathy Sister    Sleep apnea Brother     Her Social History Is Significant For: Social History   Socioeconomic History   Marital status: Married    Spouse name: Not on file   Number of children: 0   Years of education: Not on file   Highest education level: Some college, no degree  Occupational History   Not on file  Tobacco Use   Smoking status: Former  Current packs/day: 0.00    Types: Cigarettes    Quit date: 12/03/2016    Years since quitting: 7.9   Smokeless tobacco: Never  Vaping Use   Vaping status: Never Used  Substance and Sexual Activity   Alcohol use: Yes    Alcohol/week: 14.0 standard drinks of alcohol    Types: 14 Glasses of wine per week   Drug use: Never   Sexual activity: Not on file  Other Topics Concern   Not on file  Social History Narrative   Lives with husband   Caffeine- coffee 2 c daily, occas soda   Right Handed   Social Drivers of Health   Financial Resource Strain: Low Risk (07/11/2023)   Received [facility]   Overall Financial Resource Strain (CARDIA)    Difficulty of Paying Living Expenses: Not hard at all  Food Insecurity: No Food Insecurity (01/14/2024)   Received from [facility]   Hunger Vital Sign    Within the past 12 months, you worried that your food would run out before you got the money to buy more.: Never true    Within the past 12 months, the food you bought just didn't last and you didn't have money to get more.: Never true  Transportation Needs: No Transportation Needs (01/14/2024)   Received from [facility]   PRAPARE - Transportation    Lack of Transportation (Medical): No    Lack of Transportation (Non-Medical): No  Physical Activity: Inactive (07/09/2022)   Received from [facility]   Exercise Vital Sign    On average, how many days per week do you engage in moderate to strenuous exercise (like a brisk walk)?: 0 days    On  average, how many minutes do you engage in exercise at this level?: 0 min  Stress: Stress Concern Present (01/14/2024)   Received from [facility] 2 of Occupational Health - Occupational Stress Questionnaire    Feeling of Stress : Rather much  Social Connections: Socially Integrated (07/11/2023)   Received from [facility]   Social Connection and Isolation Panel    In a typical week, how many times do you talk on the phone with family, friends, or neighbors?: Three times a week    How often do you get together with friends or relatives?: Twice a week    How often do you attend church or religious services?: 1 to 4 times per year    Active Member of Clubs or Organizations: Not on file    How often do you attend meetings of the clubs or organizations you belong to?: 1 to 4 times per year    Are you married, widowed, divorced, separated, never married, or living with a partner?: Married    Her Allergies Are:  Allergies  Allergen Reactions   Codeine Itching   Other     Cats, dogs  :   Her Current Medications Are:  Outpatient Encounter Medications as of 11/02/2024  Medication Sig   amLODipine (NORVASC) 10 MG tablet Take 1 tablet by mouth daily.   fluticasone (FLONASE) 50 MCG/ACT nasal spray 1 spray into each nostril daily.   montelukast (SINGULAIR) 10 MG tablet Take 10 mg by mouth daily. nightly   olmesartan (BENICAR) 20 MG tablet Take 1 tablet by mouth daily.   omeprazole (PRILOSEC) 20 MG capsule Take 20 mg by mouth daily.   traZODone (DESYREL) 50 MG tablet TAKE ONE-HALF (1/2) TABLET NIGHTLY   triamterene-hydrochlorothiazide (  DYAZIDE) 37.5-25 MG capsule Take 1 capsule by mouth daily.   valACYclovir (VALTREX) 500 MG tablet Take 500 mg by mouth in the morning and at bedtime.   venlafaxine XR (EFFEXOR-XR) 75 MG 24 hr capsule Take 75 mg by mouth daily.   furosemide (LASIX) 20 MG tablet Take by mouth as needed. (Patient not taking: Reported on 05/06/2023)   UNKNOWN TO  PATIENT Cream for itching (Patient not taking: Reported on 11/02/2024)   No facility-administered encounter medications on file as of 11/02/2024.  :  Review of Systems:  Out of a complete 14 point review of systems, all are reviewed and negative with the exception of these symptoms as listed below:  Review of Systems  Objective:  Neurological Exam  Physical Exam Physical Examination:   Vitals:   11/02/24 1332  BP: (!) 143/79  Pulse: 71    General Examination: The patient is a very pleasant 63 y.o. female in no acute distress. She appears well-developed and well-nourished and well groomed.   HEENT: Normocephalic, atraumatic, pupils are equal, round and reactive to light, extraocular tracking is well-preserved.  Hearing is grossly intact.  Face is symmetric with normal facial animation, speech is clear without dysarthria, hypophonia or voice tremor.  Neck with full range of motion, no carotid bruits on auscultation.  Airway examination reveals mild to moderate mouth dryness, otherwise stable findings.  Tongue protrudes centrally and palate elevates symmetrically.     Chest: Clear to auscultation without wheezing, rhonchi or crackles noted.   Heart: S1+S2+0, regular and normal without murmurs, rubs or gallops noted.    Abdomen: Soft, non-tender and non-distended.   Extremities: There is no obvious edema in the distal lower extremities bilaterally.  Mild nonpitting puffiness noted.   Skin: Warm and dry without trophic changes noted.    Musculoskeletal: exam reveals no obvious joint deformities.    Neurologically:  Mental status: The patient is awake, alert and oriented in all 4 spheres. Her immediate and remote memory, attention, language skills and fund of knowledge are appropriate. There is no evidence of aphasia, agnosia, apraxia or anomia. Speech is clear with normal prosody and enunciation. Thought process is linear. Mood is normal and affect is normal.  Cranial nerves II - XII  are as described above under HEENT exam.  Motor exam: Normal bulk, moving all 4 extremities without limitation, no obvious action or resting tremor.  Fine motor skills and coordination: grossly intact.  Cerebellar testing: No dysmetria or intention tremor. There is no truncal or gait ataxia.  Sensory exam: intact to light touch in the upper and lower extremities.  Gait, station and balance: She stands easily. No veering to one side is noted. No leaning to one side is noted. Posture is age-appropriate and stance is narrow based. Gait shows normal stride length and normal pace. No problems turning are noted.    Assessment and Plan:    In summary, Rachel Ramos is a very pleasant 63 year old female with an underlying medical history of allergies, reflux disease, hypertension, hyperlipidemia, paresthesias, anxiety, depression, endometriosis, plantar fasciitis and obesity, who presents for follow-up consultation of her obstructive sleep apnea.  She is not consistent with her AutoPap lately.  She would like to get back on treatment but would like a mask fit appointment first.  I placed a order for this and she is advised to get in touch with [employer] for this.  I am not sure what other DME company got involved in her care.  Of note, her home sleep test from 12/18/2022 showed moderate obstructive sleep apnea with a total AHI of 18.7/hour and O2 nadir of 82%.  Mild to moderate snoring was detected.  She has been on home AutoPap therapy since 02/12/2023.  She has a ResMed air sense 10 AutoSet machine.  Her DME company is supposed to be [employer].  I have placed an order for mask fit.  She is advised to get back on her AutoPap machine consistently.  She is advised to follow-up routinely in this clinic to see one of our nurse practitioners in about a year, sooner if needed.  I answered all her questions today and she was in agreement. I spent 25 minutes in total face-to-face time and in reviewing records  during pre-charting, more than 50% of which was spent in counseling and coordination of care, reviewing test results, reviewing medications and treatment regimen and/or in discussing or reviewing the diagnosis of OSA, the prognosis and treatment options. Pertinent laboratory and imaging test results that were available during this visit with the patient were reviewed by me and considered in my medical decision making (see chart for details).

## 2024-11-02 NOTE — Patient Instructions (Signed)
 Please start using your autoPAP regularly. While your insurance requires that you use PAP at least 4 hours each night on 70% of the nights, I recommend, that you not skip any nights and use it throughout the night if you can. Getting used to PAP and staying with the treatment long term does take time and patience and discipline. Untreated obstructive sleep apnea when it is moderate to severe can have an adverse impact on cardiovascular health and raise her risk for heart disease, arrhythmias, hypertension, congestive heart failure, stroke and diabetes. Untreated obstructive sleep apnea causes sleep disruption, nonrestorative sleep, and sleep deprivation. This can have an impact on your day to day functioning and cause daytime sleepiness and impairment of cognitive function, memory loss, mood disturbance, and problems focussing. Using PAP regularly can improve these symptoms.

## 2024-11-12 ENCOUNTER — Ambulatory Visit: Admitting: Neurology

## 2025-11-10 ENCOUNTER — Telehealth: Admitting: Neurology
